# Patient Record
Sex: Female | Born: 2000 | Race: Black or African American | Hispanic: No | Marital: Single | Smoking: Never smoker
Health system: Southern US, Community
[De-identification: ages and names within clinical notes are randomized; demographics above are authoritative.]

## PROBLEM LIST (undated history)

## (undated) DIAGNOSIS — D649 Anemia, unspecified: Secondary | ICD-10-CM

---

## 2020-01-30 HISTORY — PX: WISDOM TOOTH EXTRACTION: SHX21

## 2021-02-23 ENCOUNTER — Other Ambulatory Visit: Payer: Self-pay | Admitting: Home Modifications

## 2021-02-23 DIAGNOSIS — R112 Nausea with vomiting, unspecified: Secondary | ICD-10-CM

## 2021-02-23 DIAGNOSIS — R102 Pelvic and perineal pain: Secondary | ICD-10-CM

## 2021-03-03 ENCOUNTER — Ambulatory Visit
Admission: RE | Admit: 2021-03-03 | Discharge: 2021-03-03 | Disposition: A | Discharge status: Home / Self Care | Payer: Self-pay | Source: Ambulatory Visit | Attending: Home Modifications | Admitting: Home Modifications

## 2021-03-03 DIAGNOSIS — R112 Nausea with vomiting, unspecified: Secondary | ICD-10-CM

## 2021-03-03 DIAGNOSIS — R102 Pelvic and perineal pain: Secondary | ICD-10-CM

## 2022-07-04 ENCOUNTER — Other Ambulatory Visit (HOSPITAL_COMMUNITY)
Admission: RE | Admit: 2022-07-04 | Discharge: 2022-07-04 | Disposition: A | Discharge status: Home / Self Care | Payer: BLUE CROSS/BLUE SHIELD | Source: Ambulatory Visit | Attending: Nurse Practitioner | Admitting: Nurse Practitioner

## 2022-07-04 ENCOUNTER — Other Ambulatory Visit: Payer: Self-pay | Admitting: Nurse Practitioner

## 2022-07-04 DIAGNOSIS — Z124 Encounter for screening for malignant neoplasm of cervix: Secondary | ICD-10-CM | POA: Insufficient documentation

## 2022-07-11 LAB — CYTOLOGY - PAP
Diagnosis: NEGATIVE
Diagnosis: REACTIVE

## 2022-08-31 ENCOUNTER — Telehealth: Payer: Self-pay

## 2022-08-31 ENCOUNTER — Ambulatory Visit
Admission: RE | Admit: 2022-08-31 | Discharge: 2022-08-31 | Disposition: A | Discharge status: Home / Self Care | Payer: BLUE CROSS/BLUE SHIELD | Source: Ambulatory Visit

## 2022-08-31 VITALS — BP 113/79 | HR 96 | Temp 98.5°F | Resp 16

## 2022-08-31 DIAGNOSIS — J029 Acute pharyngitis, unspecified: Secondary | ICD-10-CM | POA: Diagnosis not present

## 2022-08-31 DIAGNOSIS — H9201 Otalgia, right ear: Secondary | ICD-10-CM

## 2022-08-31 NOTE — Discharge Instructions (Signed)
Symptoms are likely related to viral infection which will improve on its own in the next few days. Continue taking ibuprofen as needed for pain and inflammation. Warm compresses may continue to help with lymph node swelling.  You may follow-up with your primary care provider or return to urgent care if your symptoms do not improve over the next 3 to 5 days. Return to urgent care as needed.

## 2022-08-31 NOTE — ED Triage Notes (Signed)
Pt reports ear pain x 1 week, states she was in the beach x 1 week; swelling lymph nodes in neck x 2 days. Ibuprofen and hot compress gives some relief.

## 2022-08-31 NOTE — ED Provider Notes (Signed)
[facility]    CSN: [id number] Arrival date & time: 08/31/22  0848      History   Chief Complaint Chief Complaint  Patient presents with   Joint Pain    The lymph nodes in my neck are swollen and im having chronic back pain. I feel I may have a ear infection. - Entered by patient   Otalgia    HPI Ky Moskowitz is a 22 y.o. female.   Patient presents to urgent care for evaluation of right ear pain that started 1 week ago and swollen lymphnodes that started 2 days ago. She was at the beach for the last 1 week and was swimming in the ocean and the pool frequently. No drainage from the ears, dizziness, sore throat, fever/chills, or other viral URI symptoms.  Bilateral lymph node swelling of the neck has improved significantly after using warm compresses.  No recent exposures or known sick contacts with similar symptoms.  No difficulty maintaining secretions, trismus, or recent trauma/injuries to the ears or mouth.  Taking 400 mg of ibuprofen over-the-counter as needed for ear pain and states this has been helping a little bit.   Otalgia   History reviewed. No pertinent past medical history.  There are no problems to display for this patient.   History reviewed. No pertinent surgical history.  OB History   No obstetric history on file.      Home Medications    Prior to Admission medications   Medication Sig Start Date End Date Taking? Authorizing Provider  ibuprofen (ADVIL) 200 MG tablet Take 200 mg by mouth every 6 (six) hours as needed.   Yes [provider]    Family History History reviewed. No pertinent family history.  Social History Social History   Tobacco Use   Smoking status: Never   Smokeless tobacco: Never  Vaping Use   Vaping status: Never Used  Substance Use Topics   Alcohol use: Yes    Comment: Occa   Drug use: Never     Allergies   Patient has no known allergies.   Review of Systems Review of Systems  HENT:  Positive  for ear pain.   Per HPI   Physical Exam Triage Vital Signs ED Triage Vitals  Encounter Vitals Group     BP 08/31/22 0903 113/79     Systolic BP Percentile --      Diastolic BP Percentile --      Pulse Rate 08/31/22 0903 96     Resp 08/31/22 0903 16     Temp 08/31/22 0903 98.5 F (36.9 C)     Temp Source 08/31/22 0903 Oral     SpO2 08/31/22 0903 99 %     Weight --      Height --      Head Circumference --      Peak Flow --      Pain Score 08/31/22 0909 8     Pain Loc --      Pain Education --      Exclude from Growth Chart --    No data found.  Updated Vital Signs BP 113/79 (BP Location: Left Arm)   Pulse 96   Temp 98.5 F (36.9 C) (Oral)   Resp 16   LMP 08/04/2022 (Approximate)   SpO2 99%   Visual Acuity Right Eye Distance:   Left Eye Distance:   Bilateral Distance:    Right Eye Near:   Left Eye Near:    Bilateral Near:  Physical Exam Vitals and nursing note reviewed.  Constitutional:      Appearance: Normal appearance. She is not ill-appearing or toxic-appearing.  HENT:     Head: Normocephalic and atraumatic.     Right Ear: Hearing, tympanic membrane, ear canal and external ear normal.     Left Ear: Hearing, tympanic membrane, ear canal and external ear normal.     Nose: Nose normal.     Mouth/Throat:     Lips: Pink.     Mouth: Mucous membranes are moist. No injury.     Tongue: No lesions. Tongue does not deviate from midline.     Palate: No mass and lesions.     Pharynx: Oropharynx is clear. Uvula midline. Posterior oropharyngeal erythema present. No pharyngeal swelling, oropharyngeal exudate or uvula swelling.     Tonsils: No tonsillar exudate or tonsillar abscesses.     Comments: No trismus, phonation normal, maintaining secretions without difficulty. Mild erythema to the posterior oropharynx without tonsil swelling or tonsillar abscess.  Eyes:     General: Lids are normal. Vision grossly intact. Gaze aligned appropriately.     Extraocular  Movements: Extraocular movements intact.     Conjunctiva/sclera: Conjunctivae normal.  Cardiovascular:     Rate and Rhythm: Normal rate and regular rhythm.     Heart sounds: Normal heart sounds, S1 normal and S2 normal.  Pulmonary:     Effort: Pulmonary effort is normal. No respiratory distress.     Breath sounds: Normal breath sounds and air entry. No stridor. No wheezing or rhonchi.  Musculoskeletal:     Cervical back: Neck supple.  Lymphadenopathy:     Cervical: No cervical adenopathy.  Skin:    General: Skin is warm and dry.     Capillary Refill: Capillary refill takes less than 2 seconds.     Findings: No rash.  Neurological:     General: No focal deficit present.     Mental Status: She is alert and oriented to person, place, and time. Mental status is at baseline.     Cranial Nerves: No dysarthria or facial asymmetry.  Psychiatric:        Mood and Affect: Mood normal.        Speech: Speech normal.        Behavior: Behavior normal.        Thought Content: Thought content normal.        Judgment: Judgment normal.      UC Treatments / Results  Labs (all labs ordered are listed, but only abnormal results are displayed) Labs Reviewed - No data to display  EKG   Radiology No results found.  Procedures Procedures (including critical care time)  Medications Ordered in UC Medications - No data to display  Initial Impression / Assessment and Plan / UC Course  I have reviewed the triage vital signs and the nursing notes.  Pertinent labs & imaging results that were available during my care of the patient were reviewed by me and considered in my medical decision making (see chart for details).   1.  Viral pharyngitis, right ear pain Evaluation suggests viral pharyngitis etiology.  Deferred group A strep testing as patient does not meet Centor criteria and I have low suspicion for bacterial pharyngitis. Low suspicion for mononucleosis, epiglottitis, peritonsillar  abscess, etc.  HEENT exam stable without red flag signs. Will manage this conservatively with supportive care. Tylenol/ibuprofen as needed for pain and inflammation. Salt water gargles as needed, warm water with honey.  Right ear  pain likely secondary to inflammation of the head and neck due to viral illness.  Counseled patient on potential for adverse effects with medications prescribed/recommended today, strict ER and return-to-clinic precautions discussed, patient verbalized understanding.    Final Clinical Impressions(s) / UC Diagnoses   Final diagnoses:  Viral pharyngitis  Right ear pain     Discharge Instructions      Symptoms are likely related to viral infection which will improve on its own in the next few days. Continue taking ibuprofen as needed for pain and inflammation. Warm compresses may continue to help with lymph node swelling.  You may follow-up with your primary care provider or return to urgent care if your symptoms do not improve over the next 3 to 5 days. Return to urgent care as needed.     ED Prescriptions   None    PDMP not reviewed this encounter.   Reita May [place], [place] 08/31/22 [phone]

## 2022-08-31 NOTE — Telephone Encounter (Signed)
Patient called requesting Antibiotics, MyChart message sent to patient due to I called back but no answer. Left voicemail for patient to check MyChart

## 2023-02-07 ENCOUNTER — Other Ambulatory Visit: Payer: Self-pay

## 2023-02-07 ENCOUNTER — Other Ambulatory Visit: Payer: Self-pay | Admitting: Obstetrics and Gynecology

## 2023-02-07 ENCOUNTER — Encounter (HOSPITAL_COMMUNITY): Payer: Self-pay | Admitting: Obstetrics and Gynecology

## 2023-02-07 NOTE — Progress Notes (Signed)
 SDW CALL  Patient was given pre-op instructions over the phone. The opportunity was given for the patient to ask questions. No further questions asked. Patient verbalized understanding of instructions given.   PCP - Lorane Kales Cardiologist - n/a  PPM/ICD - denies Device Orders - n/a Rep Notified - n/a  Chest x-ray - denies EKG - denies Stress Test - denies ECHO - denies Cardiac Cath - denies  Sleep Study - denies  No DM  Blood Thinner Instructions: n/a Aspirin Instructions: n/a  ERAS Protcol - clears until 1030   COVID TEST- n/a   Anesthesia review: no  Patient denies shortness of breath, fever, cough and chest pain over the phone call   All instructions explained to the patient, with a verbal understanding of the material. Patient agrees to go over the instructions while at home for a better understanding.

## 2023-02-08 DIAGNOSIS — O039 Complete or unspecified spontaneous abortion without complication: Secondary | ICD-10-CM | POA: Diagnosis present

## 2023-02-08 DIAGNOSIS — Z862 Personal history of diseases of the blood and blood-forming organs and certain disorders involving the immune mechanism: Secondary | ICD-10-CM | POA: Insufficient documentation

## 2023-02-08 NOTE — Progress Notes (Signed)
 patient voiced understanding of new arrival time of 1330 on monday and clear liquids okay until 1300

## 2023-02-08 NOTE — H&P (Addendum)
 Margaret Stuart is an 23 y.o. female, G1P0, presenting for scheduled D&E on 02/11/23 due to missed AB.  Patient was seen in the office on 02/06/23 for missed period US  and visit--US  showed fetal demise at 8 4/7 weeks. Her LMP was certain at 11/30/22, should have been 9 5/7 weeks on that date.  Patient denied any bleeding or pain.  Options for management of the loss were reviewed, and patient elected to proceed with scheduling the D&E on 02/11/23, per her preference for date of surgery.  Patient does was Anora genetic testing on DOS.  Patient came into the office on 02/08/23 for ABO/RH lab draw--O+.  Patient Active Problem List   Diagnosis Date Noted   Miscarriage 02/08/2023   History of anemia 02/08/2023    Pertinent Gynecological History: Menses: Regular Bleeding: None Sexually transmitted diseases: no past history Previous GYN Procedures:  NA   Last pap:  None   OB History: G1, P0   MEDICAL/FAMILY/SOCIAL HX: No significant family history noted    Past Medical History:  Diagnosis Date   Anemia     Past Surgical History:  Procedure Laterality Date   WISDOM TOOTH EXTRACTION  2022    History reviewed. No pertinent family history.  Social History:  reports that she has never smoked. She has never used smokeless tobacco. She reports that she does not currently use alcohol. She reports that she does not use drugs.  Patient is single, has excellent family support.  ALLERGIES/MEDS:  Allergies: No Known Allergies  No medications prior to admission.     ROS:  Denies any bleeding or pain, no SOB or chest pain.  Height 4' 10 (1.473 m), weight 46 kg. Physical Exam Chest clear Heart RRR without murmur Abd soft, NT Pelvic--deferred Ext WNL  ASSESSMENT: Missed AB at 8 4/7 weeks by size, 10 3/7 weeks by dates Desires scheduled D&E, with Anora genetic testing on POC. Blood type O+  PLAN: Admit to [facility] on 02/11/23 for scheduled D&E per Dr. Henry. Routine  pre-op orders Plan Anora genetic testing. Support to patient and family for loss. F/u per Dr. Henry plan of care.   Orie Bonus CNM 02/08/2023,

## 2023-02-11 ENCOUNTER — Ambulatory Visit (HOSPITAL_COMMUNITY): Payer: BLUE CROSS/BLUE SHIELD | Admitting: Certified Registered Nurse Anesthetist

## 2023-02-11 ENCOUNTER — Encounter (HOSPITAL_COMMUNITY): Payer: Self-pay | Admitting: Obstetrics and Gynecology

## 2023-02-11 ENCOUNTER — Other Ambulatory Visit: Payer: Self-pay

## 2023-02-11 ENCOUNTER — Encounter (HOSPITAL_COMMUNITY)
Admission: RE | Disposition: A | Discharge status: Home / Self Care | Payer: Self-pay | Source: Home / Self Care | Attending: Obstetrics and Gynecology

## 2023-02-11 ENCOUNTER — Ambulatory Visit (HOSPITAL_COMMUNITY)
Admission: RE | Admit: 2023-02-11 | Discharge: 2023-02-11 | Disposition: A | Discharge status: Home / Self Care | Payer: BLUE CROSS/BLUE SHIELD | Attending: Obstetrics and Gynecology | Admitting: Obstetrics and Gynecology

## 2023-02-11 DIAGNOSIS — Z3A08 8 weeks gestation of pregnancy: Secondary | ICD-10-CM | POA: Insufficient documentation

## 2023-02-11 DIAGNOSIS — O021 Missed abortion: Secondary | ICD-10-CM | POA: Insufficient documentation

## 2023-02-11 DIAGNOSIS — O039 Complete or unspecified spontaneous abortion without complication: Secondary | ICD-10-CM | POA: Diagnosis present

## 2023-02-11 HISTORY — PX: DILATION AND EVACUATION: SHX1459

## 2023-02-11 HISTORY — DX: Anemia, unspecified: D64.9

## 2023-02-11 LAB — TYPE AND SCREEN
ABO/RH(D): O POS
Antibody Screen: NEGATIVE

## 2023-02-11 LAB — CBC
HCT: 32 % — ABNORMAL LOW (ref 36.0–46.0)
Hemoglobin: 10.2 g/dL — ABNORMAL LOW (ref 12.0–15.0)
MCH: 22.5 pg — ABNORMAL LOW (ref 26.0–34.0)
MCHC: 31.9 g/dL (ref 30.0–36.0)
MCV: 70.5 fL — ABNORMAL LOW (ref 80.0–100.0)
Platelets: 258 10*3/uL (ref 150–400)
RBC: 4.54 MIL/uL (ref 3.87–5.11)
RDW: 16 % — ABNORMAL HIGH (ref 11.5–15.5)
WBC: 10.1 10*3/uL (ref 4.0–10.5)
nRBC: 0 % (ref 0.0–0.2)

## 2023-02-11 LAB — ABO/RH: ABO/RH(D): O POS

## 2023-02-11 SURGERY — DILATION AND EVACUATION, UTERUS
Anesthesia: General | Site: Vagina

## 2023-02-11 MED ORDER — EPHEDRINE SULFATE-NACL 50-0.9 MG/10ML-% IV SOSY
PREFILLED_SYRINGE | INTRAVENOUS | Status: DC | PRN
  Administered 2023-02-11: 10 mg via INTRAVENOUS

## 2023-02-11 MED ORDER — FENTANYL CITRATE (PF) 100 MCG/2ML IJ SOLN
25.0000 ug | INTRAMUSCULAR | Status: DC | PRN
Start: 2023-02-11 — End: 2023-02-11

## 2023-02-11 MED ORDER — DEXMEDETOMIDINE HCL IN NACL 80 MCG/20ML IV SOLN
INTRAVENOUS | Status: DC | PRN
  Administered 2023-02-11: 8 ug via INTRAVENOUS

## 2023-02-11 MED ORDER — ACETAMINOPHEN 10 MG/ML IV SOLN
1000.0000 mg | Freq: Once | INTRAVENOUS | Status: DC | PRN
Start: 2023-02-11 — End: 2023-02-11

## 2023-02-11 MED ORDER — 0.9 % SODIUM CHLORIDE (POUR BTL) OPTIME
TOPICAL | Status: DC | PRN
  Administered 2023-02-11: 1000 mL

## 2023-02-11 MED ORDER — MIDAZOLAM HCL 2 MG/2ML IJ SOLN
INTRAMUSCULAR | Status: AC
Start: 2023-02-11 — End: ?
  Filled 2023-02-11: qty 2

## 2023-02-11 MED ORDER — LIDOCAINE HCL 2 % IJ SOLN
INTRAMUSCULAR | Status: DC | PRN
  Administered 2023-02-11: 10 mL

## 2023-02-11 MED ORDER — LACTATED RINGERS IV SOLN: INTRAVENOUS | Status: DC

## 2023-02-11 MED ORDER — KETOROLAC TROMETHAMINE 15 MG/ML IJ SOLN
INTRAMUSCULAR | Status: DC | PRN
  Administered 2023-02-11: 15 mg via INTRAVENOUS

## 2023-02-11 MED ORDER — PROPOFOL 10 MG/ML IV BOLUS
INTRAVENOUS | Status: DC | PRN
  Administered 2023-02-11: 200 mg via INTRAVENOUS

## 2023-02-11 MED ORDER — DEXAMETHASONE SODIUM PHOSPHATE 10 MG/ML IJ SOLN
INTRAMUSCULAR | Status: DC | PRN
  Administered 2023-02-11: 10 mg via INTRAVENOUS

## 2023-02-11 MED ORDER — PROPOFOL 10 MG/ML IV BOLUS
INTRAVENOUS | Status: AC
  Filled 2023-02-11: qty 20

## 2023-02-11 MED ORDER — POVIDONE-IODINE 10 % EX SWAB
2.0000 | Freq: Once | CUTANEOUS | Status: AC
  Administered 2023-02-11: 2 via TOPICAL

## 2023-02-11 MED ORDER — OXYCODONE HCL 5 MG PO TABS: 5.0000 mg | ORAL_TABLET | Freq: Once | ORAL | Status: DC | PRN

## 2023-02-11 MED ORDER — CHLORHEXIDINE GLUCONATE 0.12 % MT SOLN
15.0000 mL | Freq: Once | OROMUCOSAL | Status: AC
  Administered 2023-02-11: 15 mL via OROMUCOSAL
  Filled 2023-02-11: qty 15

## 2023-02-11 MED ORDER — ACETAMINOPHEN 10 MG/ML IV SOLN
INTRAVENOUS | Status: DC | PRN
  Administered 2023-02-11: 1000 mg via INTRAVENOUS

## 2023-02-11 MED ORDER — LIDOCAINE HCL 2 % IJ SOLN
INTRAMUSCULAR | Status: AC
  Filled 2023-02-11: qty 20

## 2023-02-11 MED ORDER — OXYCODONE HCL 5 MG/5ML PO SOLN
5.0000 mg | Freq: Once | ORAL | Status: DC | PRN
Start: 2023-02-11 — End: 2023-02-11

## 2023-02-11 MED ORDER — PHENYLEPHRINE 80 MCG/ML (10ML) SYRINGE FOR IV PUSH (FOR BLOOD PRESSURE SUPPORT)
PREFILLED_SYRINGE | INTRAVENOUS | Status: DC | PRN
  Administered 2023-02-11: 160 ug via INTRAVENOUS

## 2023-02-11 MED ORDER — LIDOCAINE HCL (CARDIAC) PF 100 MG/5ML IV SOSY
PREFILLED_SYRINGE | INTRAVENOUS | Status: DC | PRN
  Administered 2023-02-11: 60 mg via INTRATRACHEAL

## 2023-02-11 MED ORDER — OXYCODONE HCL 5 MG PO TABS: 5.0000 mg | ORAL_TABLET | Freq: Four times a day (QID) | ORAL | 0 refills | Status: AC | PRN

## 2023-02-11 MED ORDER — ACETAMINOPHEN 500 MG PO TABS: 1000.0000 mg | ORAL_TABLET | Freq: Once | ORAL | Status: DC | PRN

## 2023-02-11 MED ORDER — ONDANSETRON HCL 4 MG/2ML IJ SOLN
INTRAMUSCULAR | Status: DC | PRN
  Administered 2023-02-11: 4 mg via INTRAVENOUS

## 2023-02-11 MED ORDER — FENTANYL CITRATE (PF) 250 MCG/5ML IJ SOLN
INTRAMUSCULAR | Status: AC
  Filled 2023-02-11: qty 5

## 2023-02-11 MED ORDER — IBUPROFEN 600 MG PO TABS: 600.0000 mg | ORAL_TABLET | Freq: Four times a day (QID) | ORAL | 1 refills | Status: AC | PRN

## 2023-02-11 MED ORDER — MIDAZOLAM HCL 2 MG/2ML IJ SOLN
INTRAMUSCULAR | Status: DC | PRN
  Administered 2023-02-11: 2 mg via INTRAVENOUS

## 2023-02-11 MED ORDER — ACETAMINOPHEN 160 MG/5ML PO SOLN: 1000.0000 mg | Freq: Once | ORAL | Status: DC | PRN

## 2023-02-11 MED ORDER — ORAL CARE MOUTH RINSE: 15.0000 mL | Freq: Once | OROMUCOSAL | Status: AC

## 2023-02-11 SURGICAL SUPPLY — 19 items
CATH ROBINSON RED A/P 16FR (CATHETERS) ×3 IMPLANT
FILTER UTR ASPR ASSEMBLY (MISCELLANEOUS) ×3 IMPLANT
GLOVE BIO SURGEON STRL SZ7.5 (GLOVE) ×3 IMPLANT
GLOVE BIOGEL PI IND STRL 7.0 (GLOVE) ×3 IMPLANT
GLOVE BIOGEL PI IND STRL 7.5 (GLOVE) ×3 IMPLANT
GOWN STRL REUS W/ TWL LRG LVL3 (GOWN DISPOSABLE) ×6 IMPLANT
HOSE CONNECTING 18IN BERKELEY (TUBING) ×3 IMPLANT
KIT BERKELEY 1ST TRIMESTER 3/8 (MISCELLANEOUS) ×6 IMPLANT
NS IRRIG 1000ML POUR BTL (IV SOLUTION) ×3 IMPLANT
PACK VAGINAL MINOR WOMEN LF (CUSTOM PROCEDURE TRAY) ×3 IMPLANT
PAD OB MATERNITY 4.3X12.25 (PERSONAL CARE ITEMS) ×3 IMPLANT
SET BERKELEY SUCTION TUBING (SUCTIONS) ×3 IMPLANT
SPIKE FLUID TRANSFER (MISCELLANEOUS) ×3 IMPLANT
TOWEL GREEN STERILE FF (TOWEL DISPOSABLE) ×6 IMPLANT
UNDERPAD 30X36 HEAVY ABSORB (UNDERPADS AND DIAPERS) ×3 IMPLANT
VACURETTE 10 RIGID CVD (CANNULA) IMPLANT
VACURETTE 7MM CVD STRL WRAP (CANNULA) IMPLANT
VACURETTE 8 RIGID CVD (CANNULA) IMPLANT
VACURETTE 9 RIGID CVD (CANNULA) IMPLANT

## 2023-02-11 NOTE — Anesthesia Preprocedure Evaluation (Signed)
 Anesthesia Evaluation  Patient identified by MRN, date of birth, ID band Patient awake    Reviewed: Allergy & Precautions, H&P , NPO status , Patient's Chart, lab work & pertinent test results  Airway Mallampati: I  TM Distance: >3 FB Neck ROM: Full    Dental  (+) Teeth Intact, Dental Advisory Given   Pulmonary neg pulmonary ROS   breath sounds clear to auscultation       Cardiovascular negative cardio ROS  Rhythm:Regular     Neuro/Psych negative neurological ROS  negative psych ROS   GI/Hepatic negative GI ROS, Neg liver ROS,,,  Endo/Other  negative endocrine ROS    Renal/GU negative Renal ROS     Musculoskeletal negative musculoskeletal ROS (+)    Abdominal   Peds  Hematology negative hematology ROS (+)   Anesthesia Other Findings   Reproductive/Obstetrics  Missed Abortion                             Anesthesia Physical Anesthesia Plan  ASA: 1  Anesthesia Plan: General   Post-op Pain Management: Ofirmev  IV (intra-op)* and Toradol  IV (intra-op)*   Induction: Intravenous  PONV Risk Score and Plan: 3 and Ondansetron  and Dexamethasone   Airway Management Planned: LMA  Additional Equipment: None  Intra-op Plan:   Post-operative Plan: Extubation in OR  Informed Consent: I have reviewed the patients History and Physical, chart, labs and discussed the procedure including the risks, benefits and alternatives for the proposed anesthesia with the patient or authorized representative who has indicated his/her understanding and acceptance.     Dental advisory given  Plan Discussed with: CRNA  Anesthesia Plan Comments:        Anesthesia Quick Evaluation

## 2023-02-11 NOTE — Op Note (Signed)
 Preop Diagnosis: Missed Abortion   Postop Diagnosis: Missed Abortion   Procedure: DILATATION AND EVACUATION   Anesthesia: General   Anesthesiologist: Leopoldo Bruckner, MD   Attending: Henry Slough, MD   Assistant: N/a  Findings: Mod POCs Specimen sent for Anora Testing  Pathology: POCs  Fluids: 300 cc  UOP: Voided prior to procedure  EBL: 10 cc  Complications: None  Procedure: The patient was taken to the operating room after the risks benefits and alternatives were discussed with the patient, the patient verbalized understanding and consent signed and witnessed.  The patient was placed under MAC anesthesia, prepped and draped in the normal sterile fashion and a time out was performed.  A bivalve speculum was placed in the patient's vagina and the anterior lip of the cervix grasped with a single-tooth tenaculum. A paracervical block was administered using a total of 10 cc of 2% lidocaine .  The uterus was sounded to 10 cm and a size 9 suction curette was used. Suction curettage was performed until minimal tissue returned. Sharp curettage was performed until a gritty texture was noted. Suction curettage was performed once again to remove any remaining debris. All instruments were removed. The count was correct. The patient was transferred to the recovery room in good condition.

## 2023-02-11 NOTE — Anesthesia Procedure Notes (Signed)
 Procedure Name: LMA Insertion Date/Time: 02/11/2023 4:51 PM  Performed by: Mannie Krystal LABOR, CRNAPre-anesthesia Checklist: Patient identified, Emergency Drugs available, Suction available, Patient being monitored and Timeout performed Patient Re-evaluated:Patient Re-evaluated prior to induction Oxygen Delivery Method: Circle system utilized Preoxygenation: Pre-oxygenation with 100% oxygen Induction Type: IV induction LMA: LMA inserted LMA Size: 3.0 Tube secured with: Tape Dental Injury: Teeth and Oropharynx as per pre-operative assessment

## 2023-02-11 NOTE — Transfer of Care (Signed)
 Immediate Anesthesia Transfer of Care Note  Patient: Margaret Stuart  Procedure(s) Performed: DILATATION AND EVACUATION CHROMOSOME STUDIES (Vagina )  Patient Location: PACU  Anesthesia Type:General  Level of Consciousness: drowsy  Airway & Oxygen Therapy: Patient Spontanous Breathing and Patient connected to nasal cannula oxygen  Post-op Assessment: Report given to RN and Post -op Vital signs reviewed and stable  Post vital signs: Reviewed and stable  Last Vitals:  Vitals Value Taken Time  BP 116/64 02/11/23 1725  Temp    Pulse 63 02/11/23 1728  Resp 24 02/11/23 1728  SpO2 100 % 02/11/23 1728  Vitals shown include unfiled device data.  Last Pain:  Vitals:   02/11/23 1353  TempSrc: Oral  PainSc:       Patients Stated Pain Goal: 0 (02/11/23 1351)  Complications: No notable events documented.

## 2023-02-11 NOTE — H&P (Signed)
 Interval H&P unchanged

## 2023-02-12 ENCOUNTER — Encounter (HOSPITAL_COMMUNITY): Payer: Self-pay | Admitting: Obstetrics and Gynecology

## 2023-02-13 LAB — SURGICAL PATHOLOGY

## 2023-02-13 NOTE — Anesthesia Postprocedure Evaluation (Signed)
 Anesthesia Post Note  Patient: Margaret Stuart  Procedure(s) Performed: DILATATION AND EVACUATION CHROMOSOME STUDIES (Vagina )     Patient location during evaluation: PACU Anesthesia Type: General Level of consciousness: awake and alert Pain management: pain level controlled Vital Signs Assessment: post-procedure vital signs reviewed and stable Respiratory status: spontaneous breathing, nonlabored ventilation and respiratory function stable Cardiovascular status: blood pressure returned to baseline and stable Postop Assessment: no apparent nausea or vomiting Anesthetic complications: no   No notable events documented.  Last Vitals:  Vitals:   02/11/23 1745 02/11/23 1755  BP: 125/61 (!) 102/59  Pulse: 73 71  Resp: 18 19  Temp:  36.6 C  SpO2: 100% 100%    Last Pain:  Vitals:   02/11/23 1755  TempSrc:   PainSc: 0-No pain                 Lodie Waheed

## 2023-02-25 LAB — ANORA MISCARRIAGE TEST - FRESH

## 2023-10-01 IMAGING — US US PELVIS COMPLETE WITH TRANSVAGINAL
1 series · 14 of 25 positions shown · non-contrast
Comparison: None

CLINICAL DATA: Pelvic pain

Nausea for 2-3 weeks
EXAM:
TRANSABDOMINAL AND TRANSVAGINAL ULTRASOUND OF PELVIS
TECHNIQUE: Both transabdominal and transvaginal ultrasound examinations of the
pelvis were performed. Transabdominal technique was performed for
global imaging of the pelvis including uterus, ovaries, adnexal
regions, and pelvic cul-de-sac. It was necessary to proceed with
endovaginal exam following the transabdominal exam to visualize the
uterus, endometrium, ovaries, and adnexa.

[Series 1: us pelvis complete with transvaginal · 0.25mm/px · 14 of 45 slices shown]
[im 1/45]
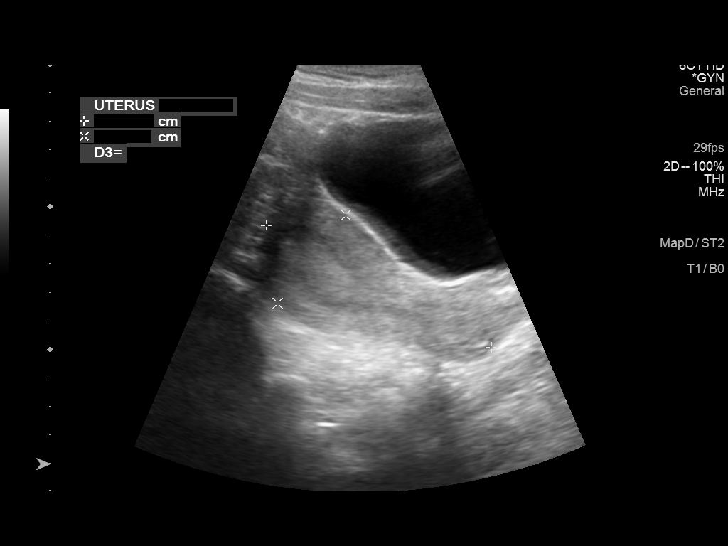
[im 4/45]
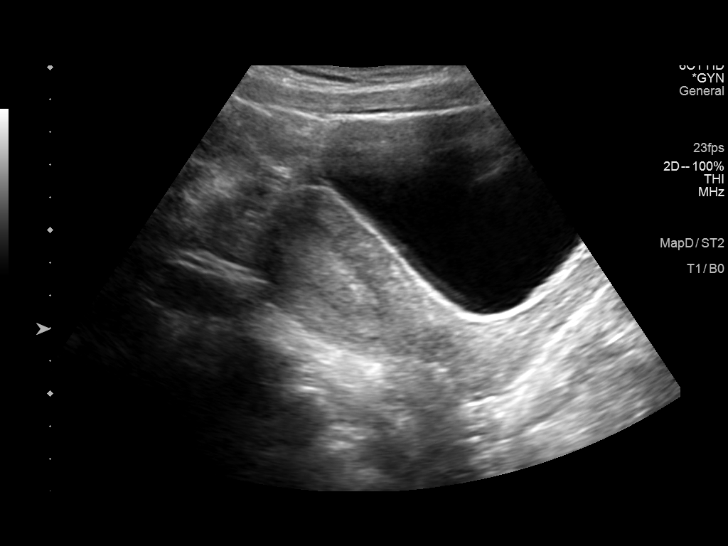
[im 8/45]
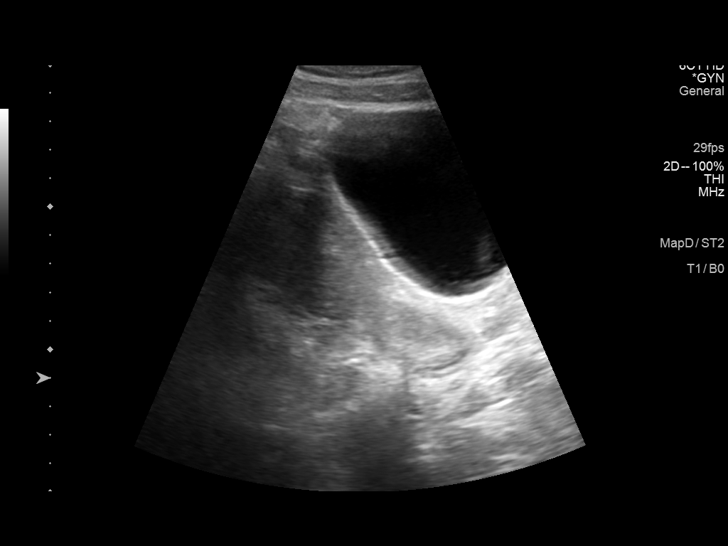
[im 12/45]
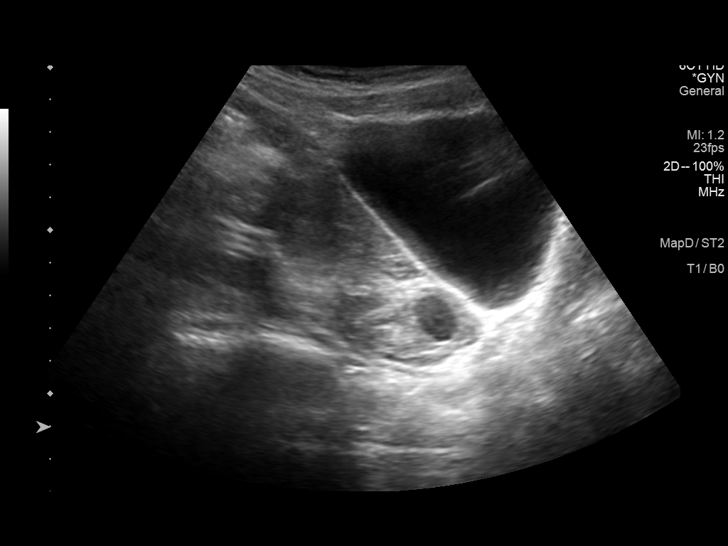
[im 15/45]
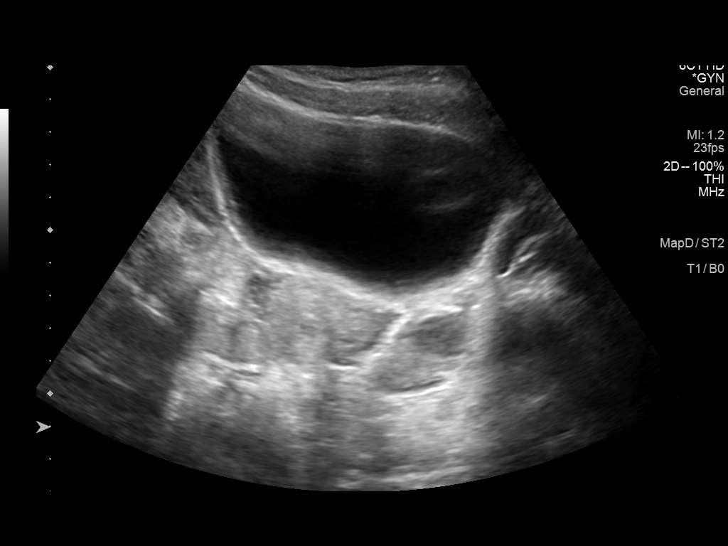
[im 17/45]
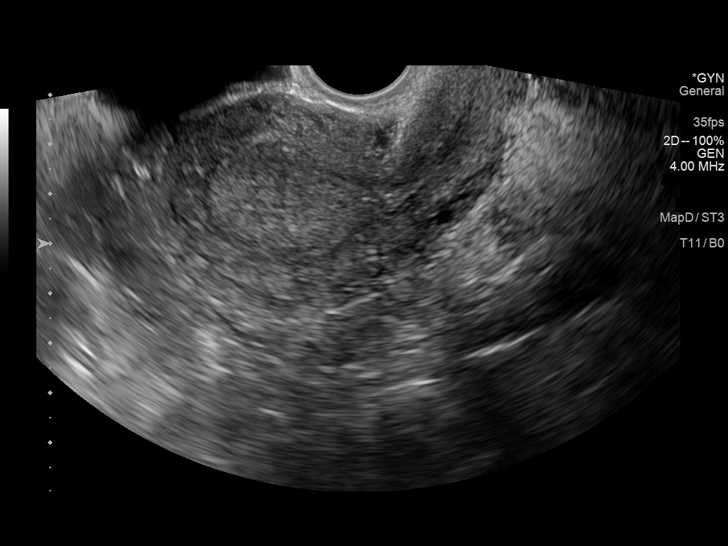
[im 21/45]
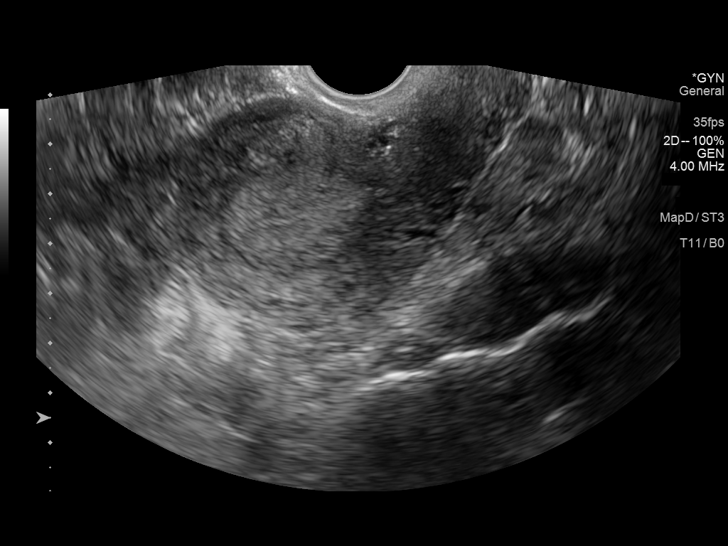
[im 24/45]
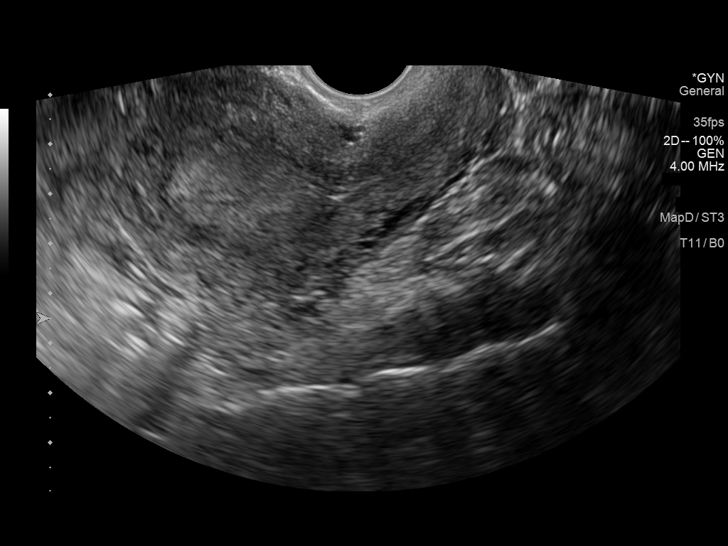
[im 28/45]
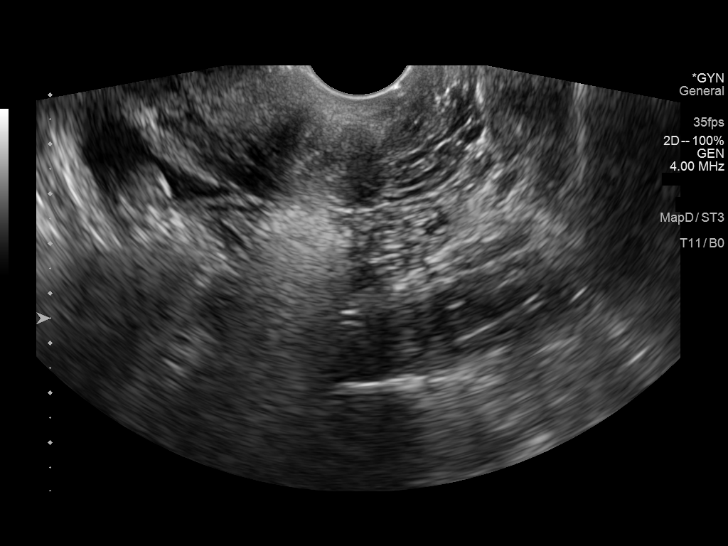
[im 30/45]
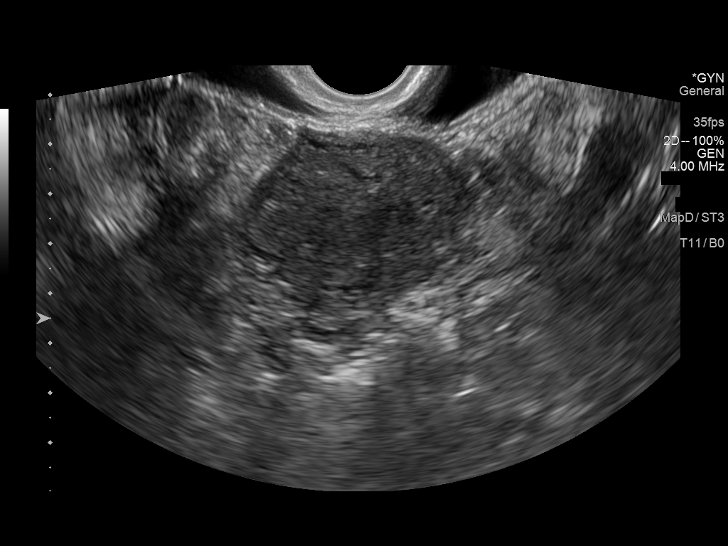
[im 34/45]
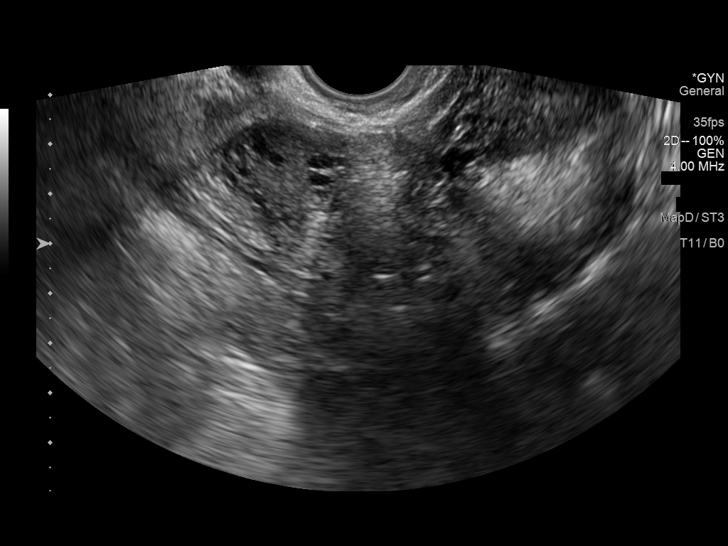
[im 37/45]
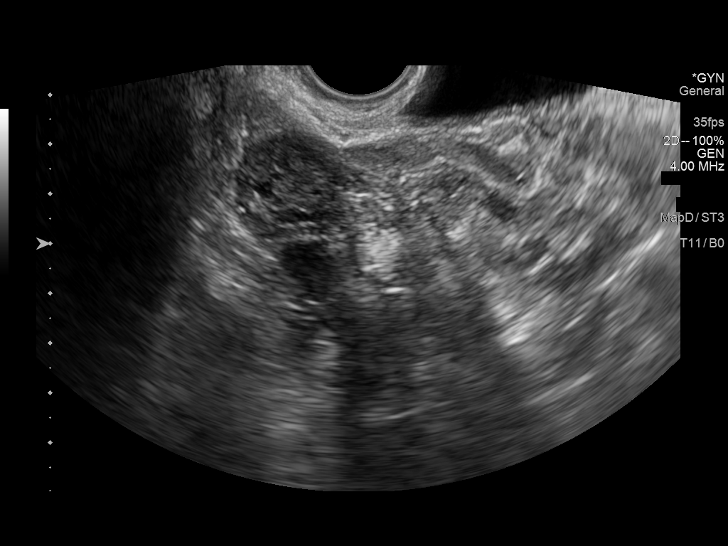
[im 41/45]
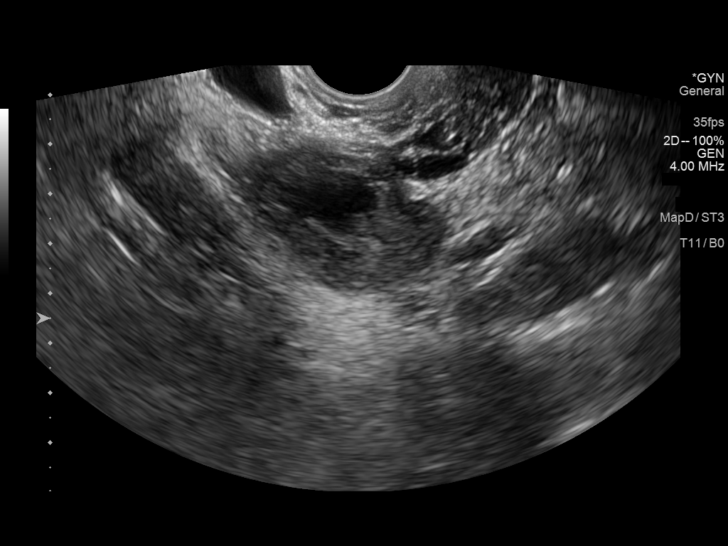
[im 45/45]
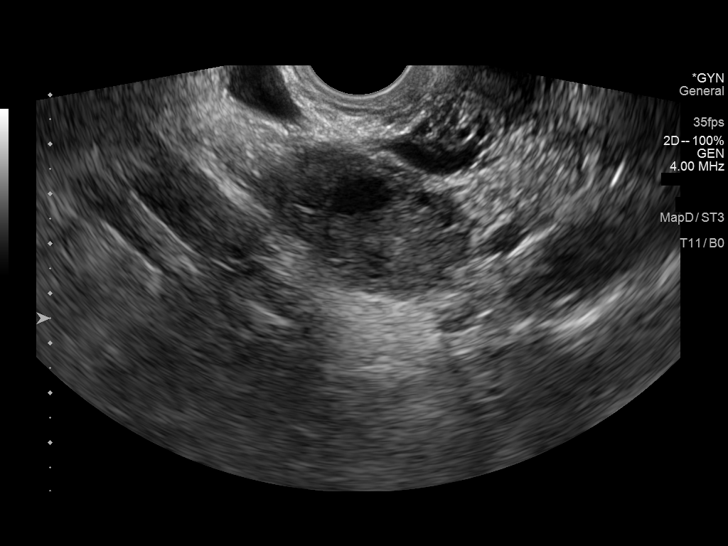

[14 of 25 positions shown; findings below may reference images not displayed]

FINDINGS: Uterus

Measurements: 9.0 x 3.9 x 5.3 cm = volume: 97 mL. No fibroids or
other mass visualized.

Endometrium

Thickness: 21 mm.  No focal abnormality visualized.

Right ovary

Measurements: 2.7 x 1.9 x 2.2 cm = volume: 6.0 mL. Normal
appearance.

Left ovary

Measurements: 4.4 x 2.6 x 2.5 cm = volume: 15.1 mL. Normal
appearance.

Other findings

No abnormal free fluid.
IMPRESSION: Thickened endometrium measuring up to 21 mm. Endometrial thickness
is considered abnormal. Consider follow-up by US in 6-8 weeks,
during the week immediately following menses (exam timing is
critical).
